# Patient Record
Sex: Male | Born: 1980
Health system: Southern US, Community
[De-identification: ages and names within clinical notes are randomized; demographics above are authoritative.]

---

## 2013-11-18 ENCOUNTER — Ambulatory Visit (INDEPENDENT_AMBULATORY_CARE_PROVIDER_SITE_OTHER): Payer: BC Managed Care – PPO | Admitting: Physician Assistant

## 2013-11-18 VITALS — BP 100/60 | HR 97 | Temp 98.7°F | Resp 16 | Ht 71.5 in | Wt 211.0 lb

## 2013-11-18 DIAGNOSIS — Z2089 Contact with and (suspected) exposure to other communicable diseases: Secondary | ICD-10-CM

## 2013-11-18 DIAGNOSIS — Z20818 Contact with and (suspected) exposure to other bacterial communicable diseases: Secondary | ICD-10-CM

## 2013-11-18 DIAGNOSIS — J029 Acute pharyngitis, unspecified: Secondary | ICD-10-CM

## 2013-11-18 MED ORDER — FIRST-DUKES MOUTHWASH MT SUSP: 5.0000 mL | OROMUCOSAL | Status: DC | PRN

## 2013-11-18 MED ORDER — AMOXICILLIN 875 MG PO TABS: 875.0000 mg | ORAL_TABLET | Freq: Two times a day (BID) | ORAL | Status: DC

## 2013-11-18 NOTE — Progress Notes (Signed)
   Subjective:    Patient ID: Albert [name], male    DOB: January 20, 1981, 33 y.o.   MRN: [id number]  HPI 33 year old male presents for evaluation of acute onset of sore throat. Symptoms started yesterday and are significantly worse today. States his wife was treated for strep throat last week (had + test).  He denies nasal congestion, cough, SOB, wheezing, otalgia, sinus pain, fever, or chills.  Has taken ibuprofen today which has helped some. No significant hx of strep infections.  Patient is otherwise healthy with no other concerns today.     Review of Systems  Constitutional: Negative for fever and chills.  HENT: Positive for sore throat. Negative for congestion.   Respiratory: Negative for cough.   Gastrointestinal: Negative for nausea, vomiting and abdominal pain.  Neurological: Negative for dizziness and headaches.       Objective:   Physical Exam  Constitutional: He is oriented to person, place, and time. He appears well-developed and well-nourished.  HENT:  Head: Normocephalic and atraumatic.  Right Ear: Hearing, tympanic membrane, external ear and ear canal normal.  Left Ear: Hearing, tympanic membrane, external ear and ear canal normal.  Mouth/Throat: Uvula is midline. Oropharyngeal exudate (2+ tonsillar swelling) and posterior oropharyngeal erythema present. No posterior oropharyngeal edema or tonsillar abscesses.  Eyes: Conjunctivae are normal.  Neck: Normal range of motion.  Cardiovascular: Normal rate, regular rhythm and normal heart sounds.   Pulmonary/Chest: Effort normal and breath sounds normal.  Neurological: He is alert and oriented to person, place, and time.  Psychiatric: He has a normal mood and affect. His behavior is normal. Judgment and thought content normal.          Assessment & Plan:  Acute pharyngitis - Plan: amoxicillin (AMOXIL) 875 MG tablet, Diphenhyd-Hydrocort-Nystatin (FIRST-DUKES MOUTHWASH) SUSP  Exposure to strep throat  Due to known  strep contact and clinical picture of an exudative tonsillitis, will go ahead and treat with amoxicillin 875 mg bid x 10 days Continue motrin or tylenol as needed for pain/fevers/chills Increase fluids and rest Out of work tomorrow Change toothbrush.  Follow up if symptoms worsen or fail to improve.

## 2015-05-10 ENCOUNTER — Ambulatory Visit (INDEPENDENT_AMBULATORY_CARE_PROVIDER_SITE_OTHER): Payer: BLUE CROSS/BLUE SHIELD | Admitting: Podiatry

## 2015-05-10 ENCOUNTER — Encounter: Payer: Self-pay | Admitting: Podiatry

## 2015-05-10 VITALS — BP 119/81 | HR 82 | Resp 16

## 2015-05-10 DIAGNOSIS — Z79899 Other long term (current) drug therapy: Secondary | ICD-10-CM | POA: Diagnosis not present

## 2015-05-10 MED ORDER — TERBINAFINE HCL 250 MG PO TABS: ORAL_TABLET | ORAL | Status: DC

## 2015-05-10 NOTE — Progress Notes (Signed)
   Subjective:    Patient ID: Albert [name], male    DOB: [birth year], 34 y.o.   MRN: [id number]  HPI  Pt presents with bilateral nail discoloration and is interested in therapies to clear this up  Review of Systems  All other systems reviewed and are negative.      Objective:   Physical Exam        Assessment & Plan:

## 2015-05-10 NOTE — Progress Notes (Signed)
Subjective:     Patient ID: Albert [name], male   DOB: 1981/08/13, 34 y.o.   MRN: [id number]  HPI patient presents with hallux nails bilateral and third nail left second nail right that have some thickness on them secondary to probable fungal infiltration that is been present for around 10 years   Review of Systems  All other systems reviewed and are negative.      Objective:   Physical Exam  Constitutional: He is oriented to person, place, and time.  Cardiovascular: Intact distal pulses.   Musculoskeletal: Normal range of motion.  Neurological: He is oriented to person, place, and time.  Skin: Skin is warm and dry.  Nursing note and vitals reviewed.   Neurovascular status is found to be intact with muscle strength adequate range of motion within normal limits and patient is noted to have thick yellow discoloration of the hallux nails bilateral second right third left of a localized nature. Skin is also dry with possible fungal infiltration and he does have a significant family history of condition upon questioning. Patient has good digital perfusion and is well oriented 3     Assessment:      mycotic nail infection and mycotic skin infection bilateral    Plan:      H&P and condition reviewed with patient. At this point I have recommended oral Lamisil topical and laser and reviewed all of these and there uses. He also we'll have liver function study done that I will evaluate for the oral medication

## 2015-05-15 ENCOUNTER — Ambulatory Visit (INDEPENDENT_AMBULATORY_CARE_PROVIDER_SITE_OTHER): Payer: BLUE CROSS/BLUE SHIELD | Admitting: Podiatry

## 2015-05-15 DIAGNOSIS — B351 Tinea unguium: Secondary | ICD-10-CM

## 2015-05-15 NOTE — Progress Notes (Signed)
Subjective:     Patient ID: Albert [name], male   DOB: [birth year], 34 y.o.   MRN: [id number]  HPI patient states here to have my nails lasered   Review of Systems     Objective:   Physical Exam Neurovascular status intact with thickness of the hallux nail bilateral second right slight fourth left    Assessment:     Mycotic nail infections    Plan:     Laser therapy administered approximate 1500 pulses tolerated well

## 2015-06-23 ENCOUNTER — Ambulatory Visit: Payer: BLUE CROSS/BLUE SHIELD | Admitting: Podiatry

## 2015-06-26 ENCOUNTER — Ambulatory Visit: Payer: BLUE CROSS/BLUE SHIELD | Admitting: Podiatry

## 2015-06-29 ENCOUNTER — Ambulatory Visit (INDEPENDENT_AMBULATORY_CARE_PROVIDER_SITE_OTHER): Payer: BLUE CROSS/BLUE SHIELD | Admitting: Podiatry

## 2015-06-29 ENCOUNTER — Encounter: Payer: Self-pay | Admitting: Podiatry

## 2015-06-29 DIAGNOSIS — B351 Tinea unguium: Secondary | ICD-10-CM

## 2015-06-29 NOTE — Progress Notes (Signed)
Subjective:     Patient ID: Albert [name], male   DOB: 05/13/1981, 34 y.o.   MRN: [id number]  HPI I seem to be doing better and my skin has improved   Review of Systems     Objective:   Physical Exam Neurovascular status intact with diminishment of yellow discoloration hallux bilateral second right and skin which has improved    Assessment:     Improvement mycotic nail infection and skin infection    Plan:     Finish oral Lamisil seven-day treatments and at this time went ahead and did laser approximate 1500 pulses tolerated well

## 2015-11-02 ENCOUNTER — Ambulatory Visit: Payer: BLUE CROSS/BLUE SHIELD | Admitting: Podiatry

## 2015-11-10 ENCOUNTER — Encounter: Payer: Self-pay | Admitting: Podiatry

## 2015-11-10 ENCOUNTER — Encounter (INDEPENDENT_AMBULATORY_CARE_PROVIDER_SITE_OTHER): Payer: Managed Care, Other (non HMO) | Admitting: Podiatry

## 2015-11-10 VITALS — BP 116/78 | HR 87 | Resp 16

## 2016-02-21 NOTE — Progress Notes (Signed)
This encounter was created in error - please disregard.

## 2016-05-28 ENCOUNTER — Telehealth: Payer: Self-pay

## 2016-05-28 NOTE — Telephone Encounter (Signed)
Returned pt call regarding a Rx refill, LVM

## 2016-05-30 ENCOUNTER — Telehealth: Payer: Self-pay | Admitting: Podiatry

## 2016-05-30 NOTE — Telephone Encounter (Signed)
Patient returned the voicemail you left him on 05 September and asked that you return his call in regards to his Lamisil Rx refill.

## 2016-08-24 ENCOUNTER — Ambulatory Visit (INDEPENDENT_AMBULATORY_CARE_PROVIDER_SITE_OTHER): Payer: 59 | Admitting: Emergency Medicine

## 2016-08-24 VITALS — BP 112/76 | HR 92 | Temp 98.7°F | Resp 16 | Ht 71.5 in | Wt 219.0 lb

## 2016-08-24 DIAGNOSIS — K602 Anal fissure, unspecified: Secondary | ICD-10-CM | POA: Diagnosis not present

## 2016-08-24 MED ORDER — LIDOCAINE (ANORECTAL) 5 % EX GEL: CUTANEOUS | 2 refills | Status: DC

## 2016-08-24 MED ORDER — DILTIAZEM GEL 2 %: CUTANEOUS | 2 refills | Status: DC

## 2016-08-24 NOTE — Patient Instructions (Addendum)
Please call me if symptoms are not resolved in 2 weeks and we will make you a GI appointment. I will go ahead and make the appointment because it takes a few weeks to get the appointment and if your symptoms resolve we can cancel that appointment. Take Citrucel once a day. Apply medications as instructed.    IF you received an x-ray today, you will receive an invoice from [facility]. Please contact [facility] at [phone] with questions or concerns regarding your invoice.   IF you received labwork today, you will receive an invoice from [facility]. Please contact [facility] at [phone] with questions or concerns regarding your invoice.   Our billing staff will not be able to assist you with questions regarding bills from these companies.  You will be contacted with the lab results as soon as they are available. The fastest way to get your results is to activate your My Chart account. Instructions are located on the last page of this paperwork. If you have not heard from us regarding the results in 2 weeks, please contact this office.      Anal Fissure, Adult An anal fissure is a small tear or crack in the skin around the anus. Bleeding from a fissure usually stops on its own within a few minutes. However, bleeding will often occur again with each bowel movement until the crack heals. What are the causes? This condition may be caused by:  Passing large, hard stool (feces).  Frequent diarrhea.  Constipation.  Inflammatory bowel disease (Crohn disease or ulcerative colitis).  Infections.  Anal sex. What are the signs or symptoms? Symptoms of this condition include:  Bleeding from the rectum.  Small amounts of blood seen on your stool, on toilet paper, or in the toilet after a bowel movement.  Painful bowel movements.  Itching or irritation around the anus. How is this diagnosed? A health care provider may diagnose this  condition by closely examining the anal area. An anal fissure can usually be seen with careful inspection. In some cases, a rectal exam may be performed, or a short tube (anoscope) may be used to examine the anal canal. How is this treated? Treatment for this condition may include:  Taking steps to avoid constipation. This may include making changes to your diet, such as increasing your intake of fiber or fluid.  Taking fiber supplements. These supplements can soften your stool to help make bowel movements easier. Your health care provider may also prescribe a stool softener if your stool is often hard.  Taking sitz baths. This may help to heal the tear.  Using medicated creams or ointments. These may be prescribed to lessen discomfort. Follow these instructions at home: Eating and drinking  Avoid foods that may be constipating, such as bananas and dairy products.  Drink enough fluid to keep your urine clear or pale yellow.  Maintain a diet that is high in fruits, whole grains, and vegetables. General instructions  Keep the anal area as clean and dry as possible.  Take sitz baths as told by your health care provider. Do not use soap in the sitz baths.  Take over-the-counter and prescription medicines only as told by your health care provider.  Use creams or ointments only as told by your health care provider.  Keep all follow-up visits as told by your health care provider. This is important. Contact a health care provider if:  You have more bleeding.  You have a fever.  You  have diarrhea that is mixed with blood.  You continue to have pain.  Your problem is getting worse rather than better. This information is not intended to replace advice given to you by your health care provider. Make sure you discuss any questions you have with your health care provider. Document Released: 09/09/2005 Document Revised: 01/17/2016 Document Reviewed: 12/05/2014 Elsevier Interactive Patient  Education  2017 [employer]

## 2016-08-24 NOTE — Progress Notes (Signed)
By signing my name below, I, [name], attest that this documentation has been prepared under the direction and in the presence of [name], MD.  Electronically Signed: [name], ED Scribe. 08/24/2016. 3:00 PM.  Chief Complaint:  Chief Complaint  Patient presents with  . Rectal Pain    sharp pain at rectum - began after a BM x 2 weeks ago     HPI: Albert [name] is a 35 y.o. male [facility] complaining of rectal pain onset 2 weeks ago. Pt reports after having a BM 2 weeks ago, he's developed rectal pain. States stool at that time was large and hard. Since, he's had pain with BM and long periods of sitting. States he has had BM consisting of softer stool, but still experiences pain. He had noticed blood with wiping a few times. He finds relief with warm water baths. He's also tried preparation H and hydrocortisone cream without relief.   No past medical history on file. No past surgical history on file. Social History   Social History  . Marital status: Married    Spouse name: N/A  . Number of children: N/A  . Years of education: N/A   Social History Main Topics  . Smoking status: Never Smoker  . Smokeless tobacco: Not on file  . Alcohol use Not on file  . Drug use: Unknown  . Sexual activity: Not on file   Other Topics Concern  . Not on file   Social History Narrative  . No narrative on file   Family History  Problem Relation Age of Onset  . Healthy Mother   . Healthy Father   . Healthy Sister   . Healthy Maternal Grandmother   . Healthy Paternal Grandmother    No Known Allergies Prior to Admission medications   Not on File     ROS: The patient denies fevers, chills, night sweats, unintentional weight loss, chest pain, palpitations, wheezing, dyspnea on exertion, nausea, vomiting, abdominal pain, dysuria, hematuria, melena, numbness, weakness, or tingling.   All other systems have been reviewed and were otherwise negative with the  exception of those mentioned in the HPI and as above.    PHYSICAL EXAM: Vitals:   08/24/16 1444  BP: 112/76  Pulse: 92  Resp: 16  Temp: 98.7 F (37.1 C)   Body mass index is 30.12 kg/m.   General: Alert, no acute distress HEENT:  Normocephalic, atraumatic, oropharynx patent. [name] Cardiovascular:  Regular rate and rhythm, no rubs murmurs or gallops.  No Carotid bruits, radial pulse intact. No pedal edema.  Respiratory: Clear to auscultation bilaterally.  No wheezes, rales, or rhonchi.  No cyanosis, no use of accessory musculature Abdominal: No organomegaly, abdomen is soft and non-tender, positive bowel sounds.  No masses. Musculoskeletal: Gait intact. No edema, tenderness Skin: No rashes. Neurologic: Facial musculature symmetric. Psychiatric: Patient acts appropriately throughout our interaction. Lymphatic: No cervical or submandibular lymphadenopathy Rectal exam: at 12 o clock there appears to be a small rectal fissure which is tender to touch. No active bleeding and on further examination, no rectal mass palpable.   ASSESSMENT/PLAN: I suspect patient has an anal fissure. Will use Cardizem gel along with lidocaine gel. Appointment made with GI in the next couple weeks for patient to be seen if symptoms have not resolved. He will also be on a fiber supplement.I personally performed the services described in this documentation, which was scribed in my presence. The recorded information has  been reviewed and is accurate.   Gross sideeffects, risk and benefits, and alternatives of medications d/w patient. Patient is aware that all medications have potential sideeffects and we are unable to predict every sideeffect or drug-drug interaction that may occur.  [name] MD 08/24/2016 2:59 PM

## 2016-08-27 ENCOUNTER — Encounter: Payer: Self-pay | Admitting: Internal Medicine

## 2016-08-29 ENCOUNTER — Other Ambulatory Visit: Payer: Self-pay

## 2016-08-29 DIAGNOSIS — K602 Anal fissure, unspecified: Secondary | ICD-10-CM | POA: Insufficient documentation

## 2016-08-29 MED ORDER — LIDOCAINE 5 % EX OINT
1.0000 | TOPICAL_OINTMENT | CUTANEOUS | 0 refills | Status: DC | PRN
Start: 2016-08-29 — End: 2018-02-13

## 2016-08-29 NOTE — Progress Notes (Signed)
Fax received to change from gel to ointment.  Discussed with Trena PlattStephanie English PA. New order generated.

## 2016-10-10 ENCOUNTER — Ambulatory Visit: Payer: Self-pay | Admitting: Internal Medicine

## 2018-02-12 ENCOUNTER — Ambulatory Visit: Payer: 59 | Admitting: Podiatry

## 2018-02-13 ENCOUNTER — Ambulatory Visit: Payer: 59 | Admitting: Podiatry

## 2018-02-13 ENCOUNTER — Encounter: Payer: Self-pay | Admitting: Podiatry

## 2018-02-13 DIAGNOSIS — B351 Tinea unguium: Secondary | ICD-10-CM

## 2018-02-13 MED ORDER — TERBINAFINE HCL 250 MG PO TABS: ORAL_TABLET | ORAL | 0 refills | Status: DC

## 2018-02-13 MED ORDER — TERBINAFINE HCL 250 MG PO TABS: ORAL_TABLET | ORAL | 0 refills | Status: AC

## 2018-02-16 NOTE — Progress Notes (Signed)
Subjective:   Patient ID: Albert [name], male   DOB: 37 y.o.   MRN: [id number]   HPI Patient presents stating that his nails while improved still have discoloration and he would like to continue to improve this   ROS      Objective:  Physical Exam  Neurovascular status intact with distal yellow discoloration of the nailbeds bilateral hallux with probable trauma due to activity levels     Assessment:  Combination of mycosis and fungal infection     Plan:  H&P conditions reviewed and recommended oral antifungal of a pulse method along with laser and topical medication.  Patient will have 4-6 laser treatments and is scheduled for this and will begin the pulse antifungal therapy and will be seen back by me in approximately 6 months

## 2018-03-03 ENCOUNTER — Other Ambulatory Visit: Payer: 59

## 2018-04-07 ENCOUNTER — Other Ambulatory Visit: Payer: 59

## 2018-05-05 ENCOUNTER — Other Ambulatory Visit: Payer: 59

## 2018-06-02 ENCOUNTER — Other Ambulatory Visit: Payer: 59

## 2018-10-23 ENCOUNTER — Ambulatory Visit: Payer: 59 | Admitting: Emergency Medicine

## 2019-01-11 ENCOUNTER — Other Ambulatory Visit: Payer: Self-pay | Admitting: Gastroenterology

## 2019-01-11 DIAGNOSIS — R1030 Lower abdominal pain, unspecified: Secondary | ICD-10-CM

## 2019-01-26 ENCOUNTER — Ambulatory Visit
Admission: RE | Admit: 2019-01-26 | Discharge: 2019-01-26 | Disposition: A | Discharge status: Home / Self Care | Payer: 59 | Source: Ambulatory Visit | Attending: Gastroenterology | Admitting: Gastroenterology

## 2019-01-26 ENCOUNTER — Other Ambulatory Visit: Payer: Self-pay

## 2019-01-26 ENCOUNTER — Other Ambulatory Visit: Payer: Self-pay | Admitting: Gastroenterology

## 2019-01-26 DIAGNOSIS — R1030 Lower abdominal pain, unspecified: Secondary | ICD-10-CM

## 2020-05-08 ENCOUNTER — Telehealth (HOSPITAL_COMMUNITY): Payer: Self-pay | Admitting: Nurse Practitioner

## 2020-05-08 NOTE — Telephone Encounter (Signed)
Called to Discuss with patient about Covid symptoms and the use of regeneron, a monoclonal antibody infusion for those with mild to moderate Covid symptoms and at a high risk of hospitalization.     Pt is qualified for this infusion at the [facility] due to co-morbid conditions and/or a member of an at-risk group.     Unable to reach pt. Left message to return call. Spoke to patient's wife, she will have him call me. Sx onset 05/01/20. Unvaccinated.   Consuello Masse, DNP, AGNP-C [phone] (Infusion Center Hotline)

## 2020-05-09 ENCOUNTER — Emergency Department (HOSPITAL_COMMUNITY)
Admission: EM | Admit: 2020-05-09 | Discharge: 2020-05-10 | Disposition: A | Discharge status: Home / Self Care | Payer: 59 | Attending: Emergency Medicine | Admitting: Emergency Medicine

## 2020-05-09 ENCOUNTER — Encounter (HOSPITAL_COMMUNITY): Payer: Self-pay | Admitting: Emergency Medicine

## 2020-05-09 ENCOUNTER — Emergency Department (HOSPITAL_COMMUNITY): Admission: EM | Admit: 2020-05-09 | Discharge: 2020-05-09 | Discharge status: Absent without leave | Payer: 59

## 2020-05-09 ENCOUNTER — Other Ambulatory Visit: Payer: Self-pay

## 2020-05-09 DIAGNOSIS — R05 Cough: Secondary | ICD-10-CM | POA: Diagnosis present

## 2020-05-09 DIAGNOSIS — Z5321 Procedure and treatment not carried out due to patient leaving prior to being seen by health care provider: Secondary | ICD-10-CM | POA: Diagnosis not present

## 2020-05-09 DIAGNOSIS — U071 COVID-19: Secondary | ICD-10-CM | POA: Diagnosis not present

## 2020-05-09 DIAGNOSIS — I959 Hypotension, unspecified: Secondary | ICD-10-CM | POA: Diagnosis not present

## 2020-05-09 NOTE — ED Triage Notes (Signed)
Pt day 9 covid symptoms reports had follow up appt today for PNA and had low BP with systolic 80-90. Pt was instructed to go to ED.

## 2020-05-10 ENCOUNTER — Other Ambulatory Visit (HOSPITAL_COMMUNITY): Payer: Self-pay | Admitting: Nurse Practitioner

## 2020-05-10 ENCOUNTER — Ambulatory Visit (HOSPITAL_COMMUNITY)
Admission: RE | Admit: 2020-05-10 | Discharge: 2020-05-10 | Disposition: A | Discharge status: Home / Self Care | Payer: 59 | Source: Ambulatory Visit | Attending: Pulmonary Disease | Admitting: Pulmonary Disease

## 2020-05-10 DIAGNOSIS — U071 COVID-19: Secondary | ICD-10-CM

## 2020-05-10 MED ORDER — EPINEPHRINE 0.3 MG/0.3ML IJ SOAJ: 0.3000 mg | Freq: Once | INTRAMUSCULAR | Status: DC | PRN

## 2020-05-10 MED ORDER — ALPRAZOLAM 0.5 MG PO TABS
0.5000 mg | ORAL_TABLET | Freq: Once | ORAL | Status: DC
  Filled 2020-05-10: qty 1

## 2020-05-10 MED ORDER — ALBUTEROL SULFATE HFA 108 (90 BASE) MCG/ACT IN AERS: 2.0000 | INHALATION_SPRAY | Freq: Once | RESPIRATORY_TRACT | Status: DC | PRN

## 2020-05-10 MED ORDER — SODIUM CHLORIDE 0.9 % IV SOLN
1200.0000 mg | Freq: Once | INTRAVENOUS | Status: AC
  Administered 2020-05-10: 1200 mg via INTRAVENOUS
  Filled 2020-05-10: qty 10

## 2020-05-10 MED ORDER — SODIUM CHLORIDE 0.9 % IV SOLN: INTRAVENOUS | Status: DC | PRN

## 2020-05-10 MED ORDER — METHYLPREDNISOLONE SODIUM SUCC 125 MG IJ SOLR: 125.0000 mg | Freq: Once | INTRAMUSCULAR | Status: DC | PRN

## 2020-05-10 MED ORDER — FAMOTIDINE IN NACL 20-0.9 MG/50ML-% IV SOLN: 20.0000 mg | Freq: Once | INTRAVENOUS | Status: DC | PRN

## 2020-05-10 MED ORDER — DIPHENHYDRAMINE HCL 50 MG/ML IJ SOLN: 50.0000 mg | Freq: Once | INTRAMUSCULAR | Status: DC | PRN

## 2020-05-10 NOTE — Progress Notes (Signed)
I connected by phone with Albert [name] on 05/10/2020 at 1:57 PM to discuss the potential use of an new treatment for mild to moderate COVID-19 viral infection in non-hospitalized patients.  This patient is a 39 y.o. male that meets the FDA criteria for Emergency Use Authorization of casirivimab\imdevimab.  Has a (+) direct SARS-CoV-2 viral test result  Has mild or moderate COVID-19   Is ? 41 years of age and weighs ? 40 kg  Is NOT hospitalized due to COVID-19  Is NOT requiring oxygen therapy or requiring an increase in baseline oxygen flow rate due to COVID-19  Is within 10 days of symptom onset  Has at least one of the high risk factor(s) for progression to severe COVID-19 and/or hospitalization as defined in EUA.  Specific high risk criteria : BMI > 25   I have spoken and communicated the following to the patient or parent/caregiver:  1. FDA has authorized the emergency use of casirivimab\imdevimab for the treatment of mild to moderate COVID-19 in adults and pediatric patients with positive results of direct SARS-CoV-2 viral testing who are 54 years of age and older weighing at least 40 kg, and who are at high risk for progressing to severe COVID-19 and/or hospitalization.  2. The significant known and potential risks and benefits of casirivimab\imdevimab, and the extent to which such potential risks and benefits are unknown.  3. Information on available alternative treatments and the risks and benefits of those alternatives, including clinical trials.  4. Patients treated with casirivimab\imdevimab should continue to self-isolate and use infection control measures (e.g., wear mask, isolate, social distance, avoid sharing personal items, clean and disinfect "high touch" surfaces, and frequent handwashing) according to CDC guidelines.   5. The patient or parent/caregiver has the option to accept or refuse casirivimab\imdevimab .  After reviewing this information with the patient,  The patient agreed to proceed with receiving casirivimab\imdevimab infusion and will be provided a copy of the Fact sheet prior to receiving the infusion.Consuello Masse, DNP, AGNP-C [phone] (Infusion Center Hotline)

## 2020-05-10 NOTE — Progress Notes (Signed)
  Diagnosis: COVID-19  Physician: Dr. Wright   Procedure: Covid Infusion Clinic Med: casirivimab\imdevimab infusion - Provided patient with casirivimab\imdevimab fact sheet for patients, parents and caregivers prior to infusion.  Complications: No immediate complications noted.  Discharge: Discharged home   Albert Alperin  [name] 05/10/2020    

## 2020-05-10 NOTE — Discharge Instructions (Signed)

## 2020-05-10 NOTE — ED Notes (Signed)
Called pt x3 for recheck of vitals and no response  

## 2020-05-10 NOTE — Progress Notes (Signed)
Per patient, significant anxiety with needle sticks. Requests anti-anxiety medication prior to infusion. Discussed with Corrie Dandy, RN to provide medication prior to placement of IV and lay patient down.

## 2020-11-12 IMAGING — CT CT ABDOMEN AND PELVIS WITHOUT CONTRAST
1 of 2 series · 14 of 32 positions shown, 19 images · non-contrast
Comparison: None.

CLINICAL DATA: Lower abdominal and pelvic pain for 1 year. Anal
fissure.

EXAM:
CT ABDOMEN AND PELVIS WITHOUT CONTRAST
TECHNIQUE: Multidetector CT imaging of the abdomen and pelvis was performed
following the standard protocol without IV contrast.

[Series 2: abd/pelvis w/(date) · axial · 0.88mm/px · z∈[-430,+45]mm · 14 of 109 slices shown, 19 images]
[im 7/109  soft-tissue]
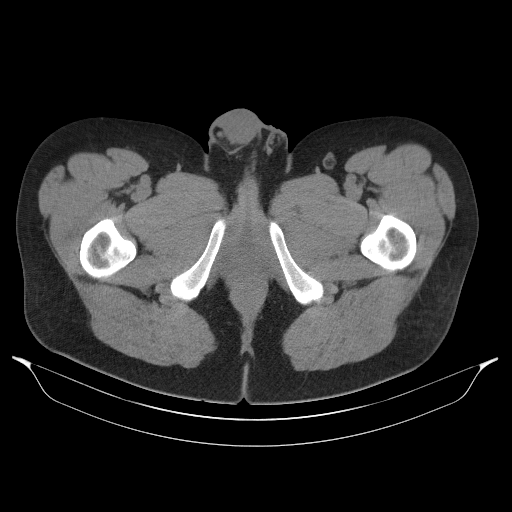
[im 7/109  bone]
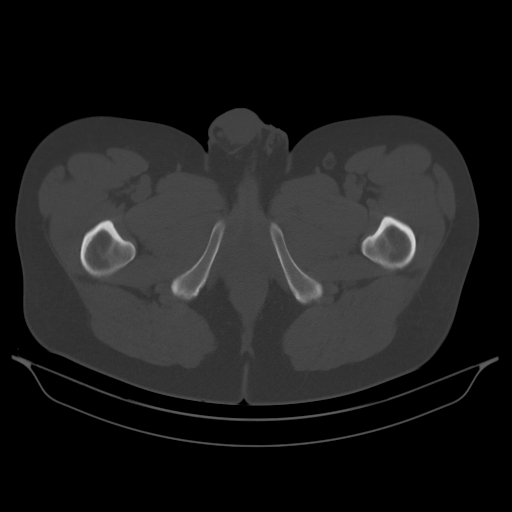
[im 14/109  soft-tissue]
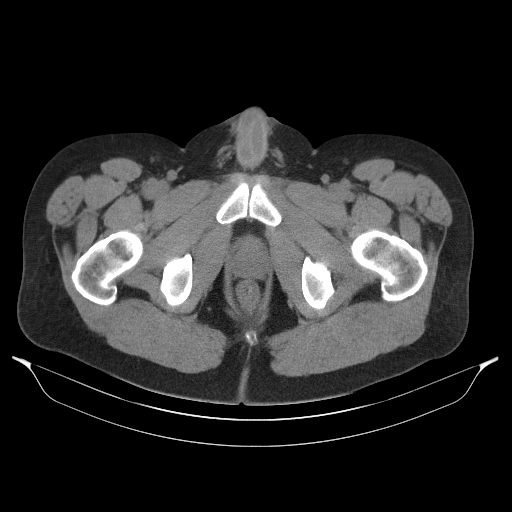
[im 21/109  soft-tissue]
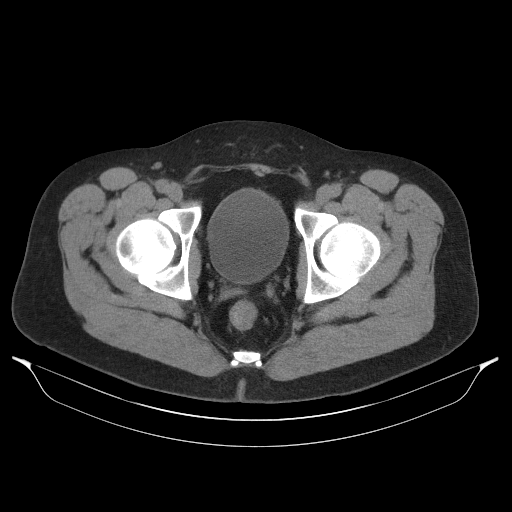
[im 34/109  soft-tissue]
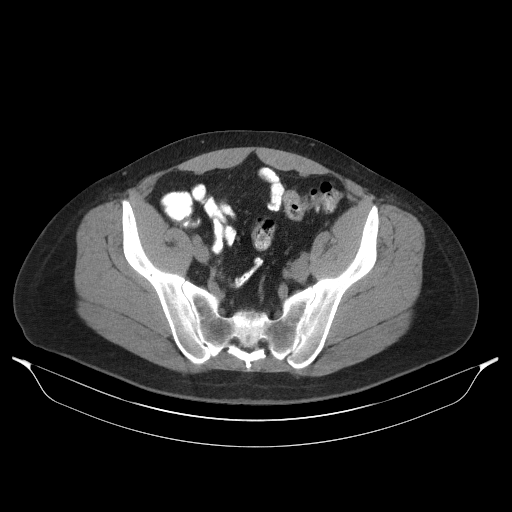
[im 41/109  soft-tissue]
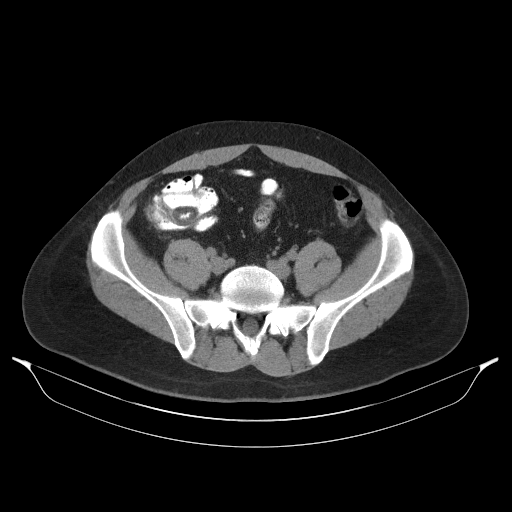
[im 48/109  soft-tissue]
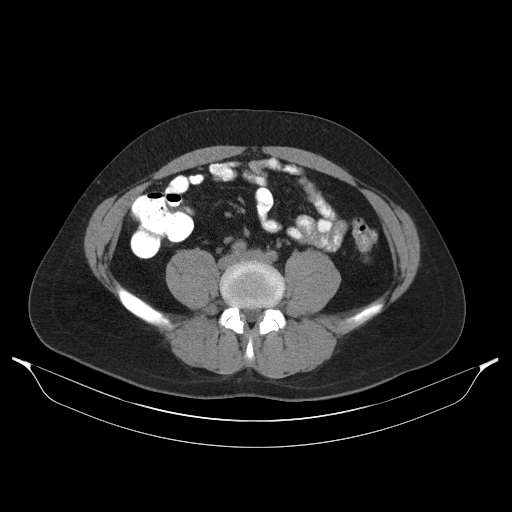
[im 55/109  soft-tissue]
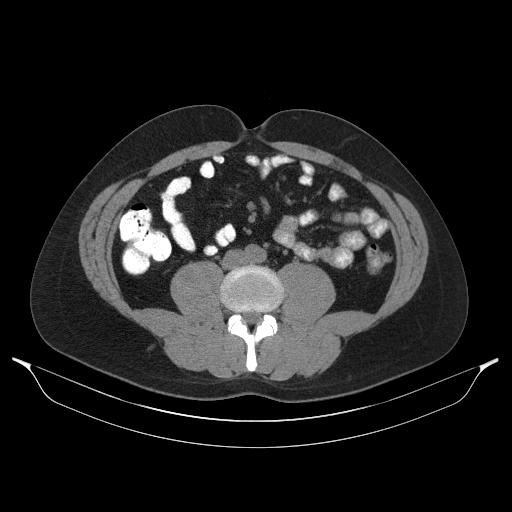
[im 61/109  soft-tissue]
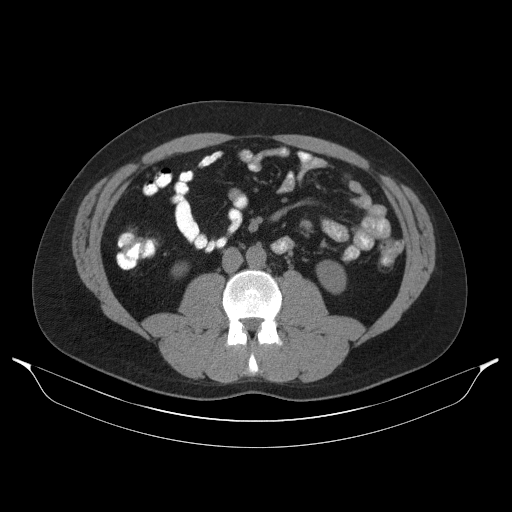
[im 68/109  soft-tissue]
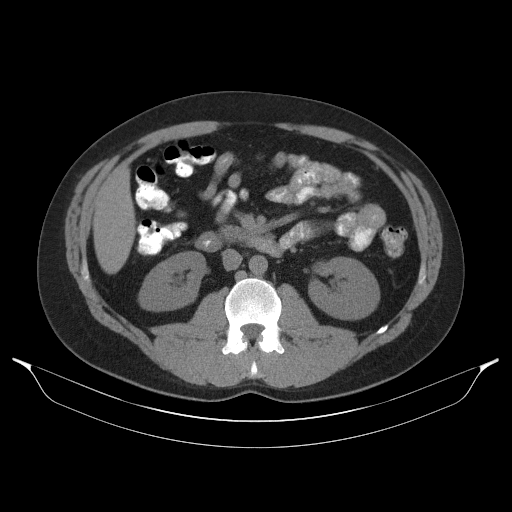
[im 68/109  bone]
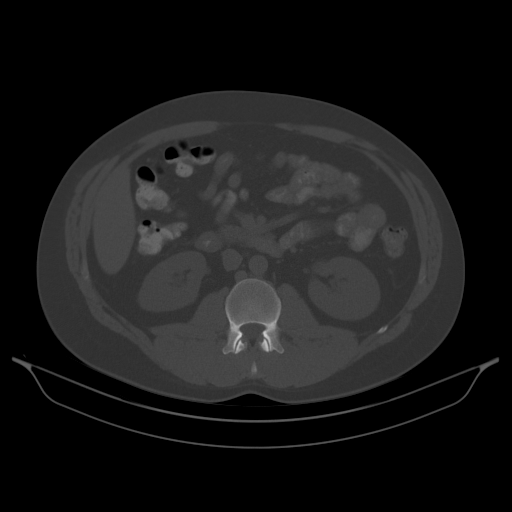
[im 75/109  soft-tissue]
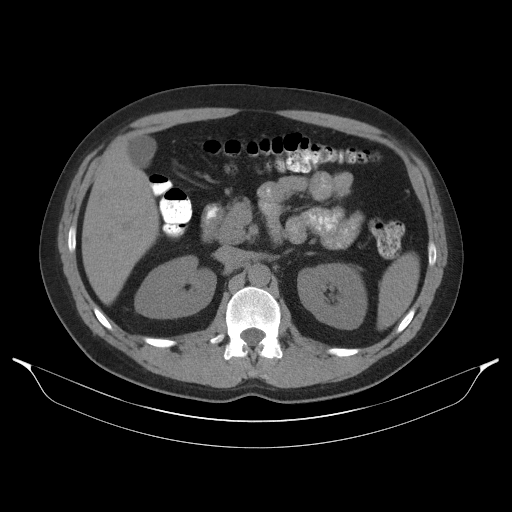
[im 82/109  lung]
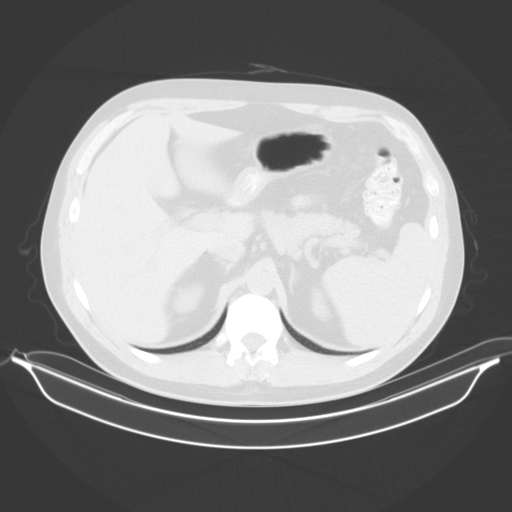
[im 88/109  soft-tissue]
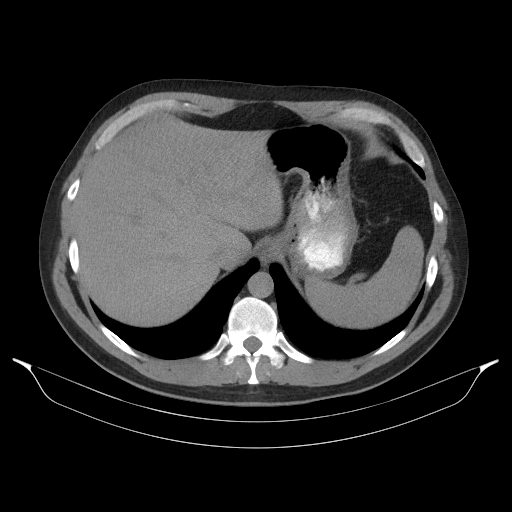
[im 88/109  lung]
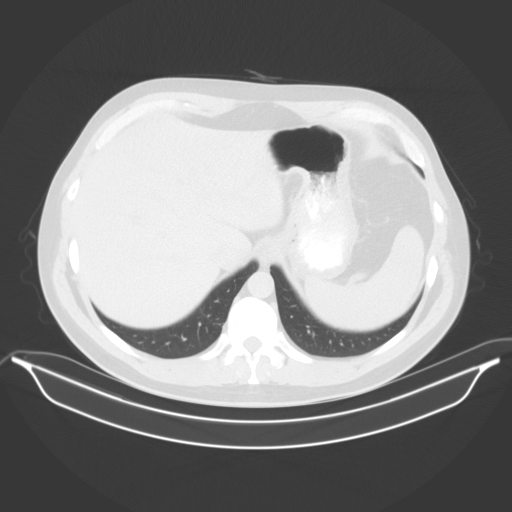
[im 95/109  soft-tissue]
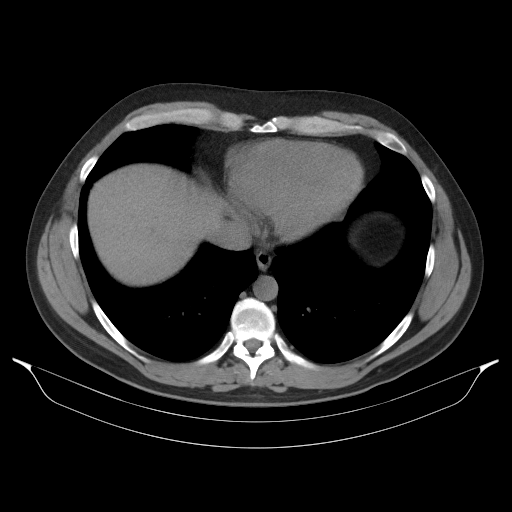
[im 95/109  lung]
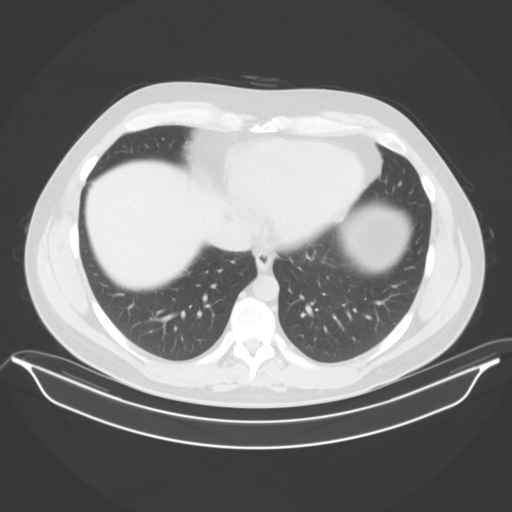
[im 102/109  soft-tissue]
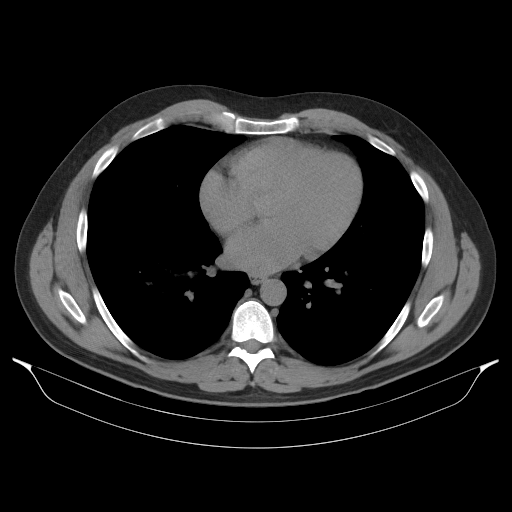
[im 102/109  lung]
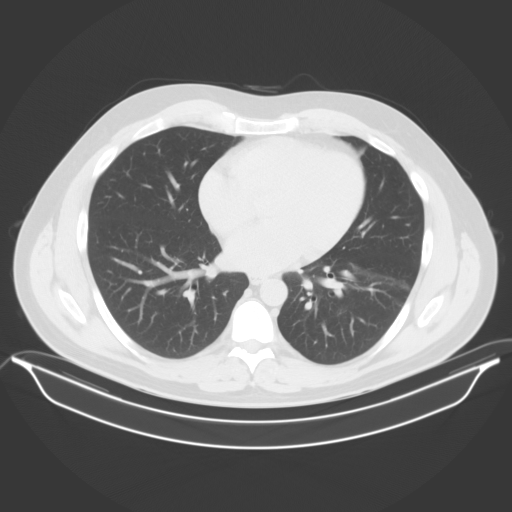

[14 of 32 positions shown; findings below may reference images not displayed]

FINDINGS: Lower chest: No acute findings.

Hepatobiliary: No mass visualized on this unenhanced exam. Tiny
sub-cm cyst seen in inferior right lobe. Gallbladder is
unremarkable.

Pancreas: No mass or inflammatory process visualized on this
unenhanced exam.

Spleen:  Within normal limits in size.

Adrenals/Urinary tract: No evidence of urolithiasis or
hydronephrosis. Unremarkable unopacified urinary bladder.

Stomach/Bowel: No evidence of obstruction, inflammatory process, or
abnormal fluid collections. Normal appendix visualized.

Vascular/Lymphatic: No pathologically enlarged lymph nodes
identified. No evidence of abdominal aortic aneurysm.

Reproductive:  No mass or other significant abnormality.

Other: No perirectal or perianal inflammatory process abscess
identified.

Musculoskeletal:  No suspicious bone lesions identified.
IMPRESSION: No acute findings or other significant abnormality identified.
